# Patient Record
Sex: Female | Born: 1985 | Hispanic: No | Marital: Single | Smoking: Never smoker
Health system: Southern US, Community
[De-identification: ages and names within clinical notes are randomized; demographics above are authoritative.]

## PROBLEM LIST (undated history)

## (undated) DIAGNOSIS — J45909 Unspecified asthma, uncomplicated: Secondary | ICD-10-CM

---

## 2014-03-19 ENCOUNTER — Emergency Department (HOSPITAL_COMMUNITY)
Admission: EM | Admit: 2014-03-19 | Discharge: 2014-03-20 | Disposition: A | Discharge status: Home / Self Care | Payer: BLUE CROSS/BLUE SHIELD | Attending: Emergency Medicine | Admitting: Emergency Medicine

## 2014-03-19 ENCOUNTER — Encounter (HOSPITAL_COMMUNITY): Payer: Self-pay | Admitting: *Deleted

## 2014-03-19 DIAGNOSIS — R1012 Left upper quadrant pain: Secondary | ICD-10-CM | POA: Diagnosis present

## 2014-03-19 DIAGNOSIS — N200 Calculus of kidney: Secondary | ICD-10-CM | POA: Insufficient documentation

## 2014-03-19 DIAGNOSIS — R109 Unspecified abdominal pain: Secondary | ICD-10-CM

## 2014-03-19 DIAGNOSIS — J45909 Unspecified asthma, uncomplicated: Secondary | ICD-10-CM | POA: Diagnosis not present

## 2014-03-19 DIAGNOSIS — Z3202 Encounter for pregnancy test, result negative: Secondary | ICD-10-CM | POA: Insufficient documentation

## 2014-03-19 HISTORY — DX: Unspecified asthma, uncomplicated: J45.909

## 2014-03-19 LAB — CBC WITH DIFFERENTIAL/PLATELET
Basophils Absolute: 0 10*3/uL (ref 0.0–0.1)
Basophils Relative: 0 % (ref 0–1)
EOS PCT: 1 % (ref 0–5)
Eosinophils Absolute: 0.1 10*3/uL (ref 0.0–0.7)
HCT: 39.7 % (ref 36.0–46.0)
Hemoglobin: 13.8 g/dL (ref 12.0–15.0)
LYMPHS ABS: 1.3 10*3/uL (ref 0.7–4.0)
LYMPHS PCT: 9 % — AB (ref 12–46)
MCH: 31.7 pg (ref 26.0–34.0)
MCHC: 34.8 g/dL (ref 30.0–36.0)
MCV: 91.1 fL (ref 78.0–100.0)
MONOS PCT: 2 % — AB (ref 3–12)
Monocytes Absolute: 0.3 10*3/uL (ref 0.1–1.0)
NEUTROS ABS: 12.3 10*3/uL — AB (ref 1.7–7.7)
NEUTROS PCT: 88 % — AB (ref 43–77)
Platelets: 215 10*3/uL (ref 150–400)
RBC: 4.36 MIL/uL (ref 3.87–5.11)
RDW: 12 % (ref 11.5–15.5)
WBC: 13.9 10*3/uL — ABNORMAL HIGH (ref 4.0–10.5)

## 2014-03-19 LAB — COMPREHENSIVE METABOLIC PANEL
ALBUMIN: 4.5 g/dL (ref 3.5–5.2)
ALT: 13 U/L (ref 0–35)
AST: 16 U/L (ref 0–37)
Alkaline Phosphatase: 43 U/L (ref 39–117)
Anion gap: 11 (ref 5–15)
BUN: 17 mg/dL (ref 6–23)
CO2: 24 mmol/L (ref 19–32)
CREATININE: 0.89 mg/dL (ref 0.50–1.10)
Calcium: 9.7 mg/dL (ref 8.4–10.5)
Chloride: 103 mmol/L (ref 96–112)
GFR calc Af Amer: >90 mL/min (ref >90)
GFR calc non Af Amer: 86 mL/min — ABNORMAL LOW (ref >90)
Glucose, Bld: 141 mg/dL — ABNORMAL HIGH (ref 70–99)
Potassium: 4.3 mmol/L (ref 3.5–5.1)
SODIUM: 138 mmol/L (ref 135–145)
Total Bilirubin: 0.7 mg/dL (ref 0.3–1.2)
Total Protein: 7.2 g/dL (ref 6.0–8.3)

## 2014-03-19 LAB — URINALYSIS, ROUTINE W REFLEX MICROSCOPIC
BILIRUBIN URINE: NEGATIVE
GLUCOSE, UA: NEGATIVE mg/dL
KETONES UR: 15 mg/dL — AB
Leukocytes, UA: NEGATIVE
Nitrite: NEGATIVE
Protein, ur: NEGATIVE mg/dL
Specific Gravity, Urine: 1.019 (ref 1.005–1.030)
Urobilinogen, UA: 0.2 mg/dL (ref 0.0–1.0)
pH: 7.5 (ref 5.0–8.0)

## 2014-03-19 LAB — URINE MICROSCOPIC-ADD ON

## 2014-03-19 LAB — LIPASE, BLOOD: LIPASE: 27 U/L (ref 11–59)

## 2014-03-19 LAB — PREGNANCY, URINE: Preg Test, Ur: NEGATIVE

## 2014-03-19 NOTE — ED Notes (Signed)
The pt is c/o lt sided  Upper abd pain  Lateral side since yesterday.  She has vomited x 2.  lmp 2 weeks ago

## 2014-03-19 NOTE — ED Provider Notes (Signed)
CSN: [id number]     Arrival date & time 03/19/14  2120 History   First MD Initiated Contact with Patient 03/19/14 2333     Chief Complaint  Patient presents with  . Abdominal Pain     (Consider location/radiation/quality/duration/timing/severity/associated sxs/prior Treatment) HPI Comments: PA\atient with intermittent LUQ and Flank pain for 1 day associated with nausea and 1 episode of vomiting  At this time there is no pain   Patient is a 29 y.o. female presenting with abdominal pain. The history is provided by the patient.  Abdominal Pain Pain location:  L flank and LUQ Pain quality: aching   Pain severity:  Moderate Onset quality:  Sudden Duration:  1 day Progression:  Waxing and waning Chronicity:  New Relieved by:  None tried Worsened by:  Nothing tried Ineffective treatments:  None tried Associated symptoms: nausea and vomiting   Associated symptoms: no dysuria, no fever and no shortness of breath     Past Medical History  Diagnosis Date  . Asthma    History reviewed. No pertinent past surgical history. No family history on file. History  Substance Use Topics  . Smoking status: Never Smoker   . Smokeless tobacco: Not on file  . Alcohol Use: Yes   OB History    No data available     Review of Systems  Constitutional: Negative for fever.  Respiratory: Negative for shortness of breath.   Gastrointestinal: Positive for nausea, vomiting and abdominal pain.  Genitourinary: Positive for flank pain. Negative for dysuria, frequency and vaginal pain.  Musculoskeletal: Negative for back pain.  Skin: Negative for rash and wound.  All other systems reviewed and are negative.     Allergies  Review of patient's allergies indicates no known allergies.  Home Medications   Prior to Admission medications   Not on File   BP 125/74 mmHg  Pulse 80  Temp(Src) 98.6 F (37 C)  Resp 18  Ht  (1.626 m)  Wt 121 lb 4.1 oz (55 kg)  BMI 20.80 kg/m2  SpO2 99%  LMP  03/05/2014 Physical Exam  Constitutional: She appears well-developed and well-nourished.  HENT:  Head: Normocephalic.  Eyes: Pupils are equal, round, and reactive to light.  Neck: Normal range of motion.  Cardiovascular: Normal rate.   Pulmonary/Chest: Effort normal.  Abdominal: Soft. She exhibits no distension.  No pain at this time  Musculoskeletal: Normal range of motion.  Neurological: She is alert.  Skin: Skin is warm.  Nursing note and vitals reviewed.   ED Course  Procedures (including critical care time) Labs Review Labs Reviewed  CBC WITH DIFFERENTIAL/PLATELET - Abnormal; Notable for the following:    WBC 13.9 (*)    Neutrophils Relative % 88 (*)    Neutro Abs 12.3 (*)    Lymphocytes Relative 9 (*)    Monocytes Relative 2 (*)    All other components within normal limits  COMPREHENSIVE METABOLIC PANEL - Abnormal; Notable for the following:    Glucose, Bld 141 (*)    GFR calc non Af Amer 86 (*)    All other components within normal limits  URINALYSIS, ROUTINE W REFLEX MICROSCOPIC - Abnormal; Notable for the following:    APPearance TURBID (*)    Hgb urine dipstick LARGE (*)    Ketones, ur 15 (*)    All other components within normal limits  LIPASE, BLOOD  PREGNANCY, URINE  URINE MICROSCOPIC-ADD ON    Imaging Review No results found.   EKG Interpretation None  Patient's ultrasound showed she has moderate hydro-on the left.  No discrete stone is visualized at this time.  Patient will be discharged home with antiemetic and pain control and referral to urology for further evaluation MDM   Final diagnoses:  Flank pain         Arman FilterGail K Tameshia Bonneville, NP 03/20/14 [id number]  Derwood KaplanAnkit Nanavati, MD 03/21/14 [phone]

## 2014-03-20 ENCOUNTER — Emergency Department (HOSPITAL_COMMUNITY): Payer: BLUE CROSS/BLUE SHIELD

## 2014-03-20 MED ORDER — HYDROCODONE-ACETAMINOPHEN 5-325 MG PO TABS: 2.0000 | ORAL_TABLET | ORAL | Status: AC | PRN

## 2014-03-20 MED ORDER — ONDANSETRON 4 MG PO TBDP: 4.0000 mg | ORAL_TABLET | Freq: Three times a day (TID) | ORAL | Status: AC | PRN

## 2014-03-20 NOTE — ED Notes (Signed)
Pt remains in ultrasound at the time. 

## 2014-03-20 NOTE — ED Notes (Signed)
Spoke with ultrasound personnel regarding ETA for exam. US staff states that patient will be retrieved soon.

## 2014-03-20 NOTE — Discharge Instructions (Signed)
Her ultrasound shows that you have had a kidney stone on the left.  There is small amount of swelling of the kidney, but no blockage U been given a prescription for pain control, as well as an antiemetic to control any nausea or vomiting.  He may develop.  He will also be given referral to urology for follow-up if needed

## 2014-04-22 ENCOUNTER — Other Ambulatory Visit (HOSPITAL_COMMUNITY): Payer: Self-pay | Admitting: Family Medicine

## 2014-04-22 DIAGNOSIS — R319 Hematuria, unspecified: Secondary | ICD-10-CM

## 2014-04-22 DIAGNOSIS — N1330 Unspecified hydronephrosis: Secondary | ICD-10-CM

## 2014-05-04 ENCOUNTER — Ambulatory Visit (HOSPITAL_COMMUNITY)
Admission: RE | Admit: 2014-05-04 | Discharge: 2014-05-04 | Disposition: A | Discharge status: Home / Self Care | Payer: BLUE CROSS/BLUE SHIELD | Source: Ambulatory Visit | Attending: Family Medicine | Admitting: Family Medicine

## 2014-05-04 DIAGNOSIS — N1330 Unspecified hydronephrosis: Secondary | ICD-10-CM | POA: Insufficient documentation

## 2014-05-04 DIAGNOSIS — R319 Hematuria, unspecified: Secondary | ICD-10-CM

## 2014-05-11 ENCOUNTER — Other Ambulatory Visit (HOSPITAL_COMMUNITY): Payer: Self-pay | Admitting: Family Medicine

## 2014-05-11 DIAGNOSIS — N1330 Unspecified hydronephrosis: Secondary | ICD-10-CM

## 2016-06-09 IMAGING — US US ABDOMEN COMPLETE
1 series · 14 of 25 positions shown · non-contrast
Comparison: None.

CLINICAL DATA: Left upper abdominal pain. Microhematuria. Left
flank pain.

EXAM:
ULTRASOUND ABDOMEN COMPLETE

[Series 1: us abdomen complete · 0.24mm/px · 14 of 79 slices shown]
[im 1/79]
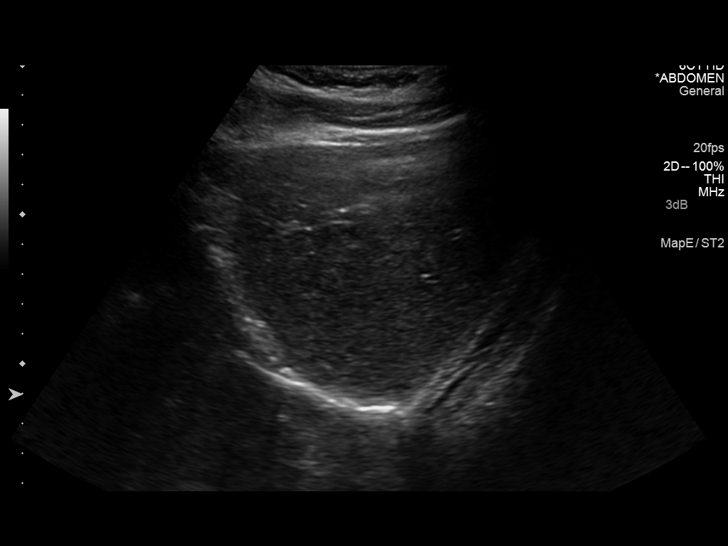
[im 7/79]
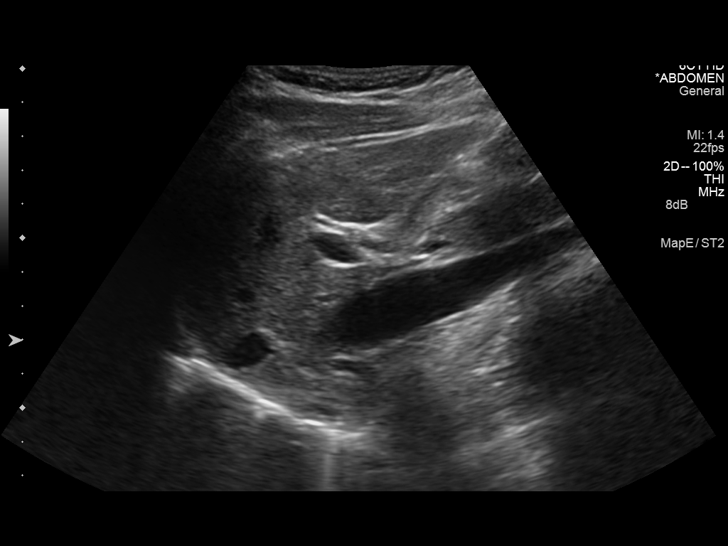
[im 14/79]
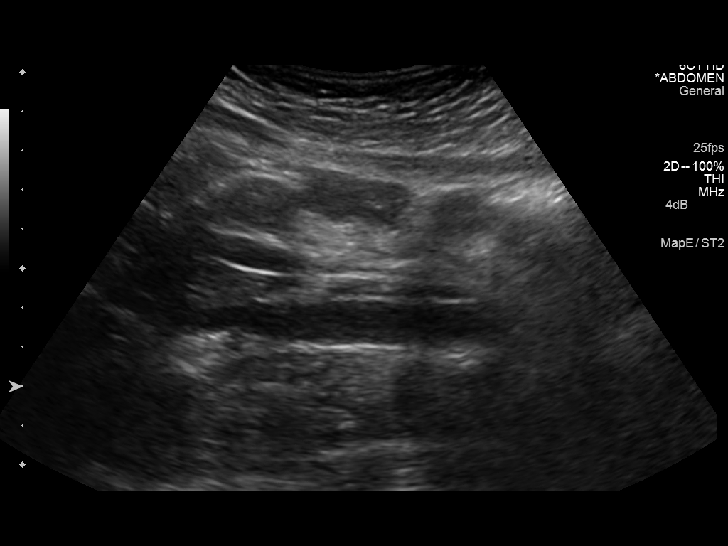
[im 20/79]
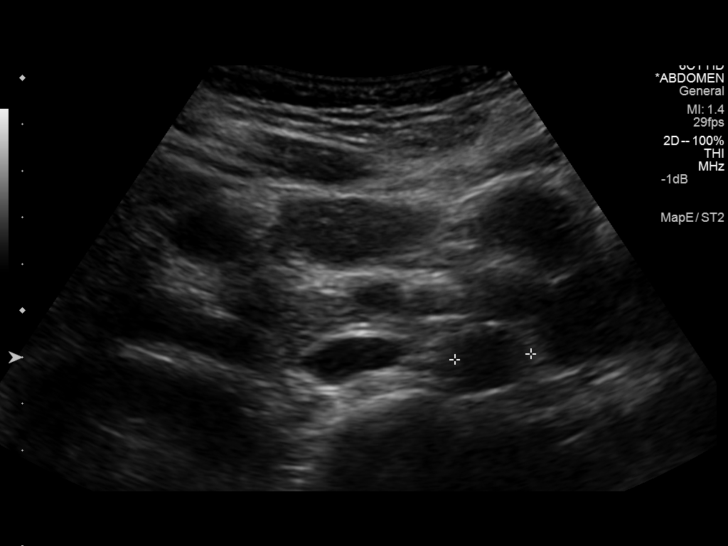
[im 27/79]
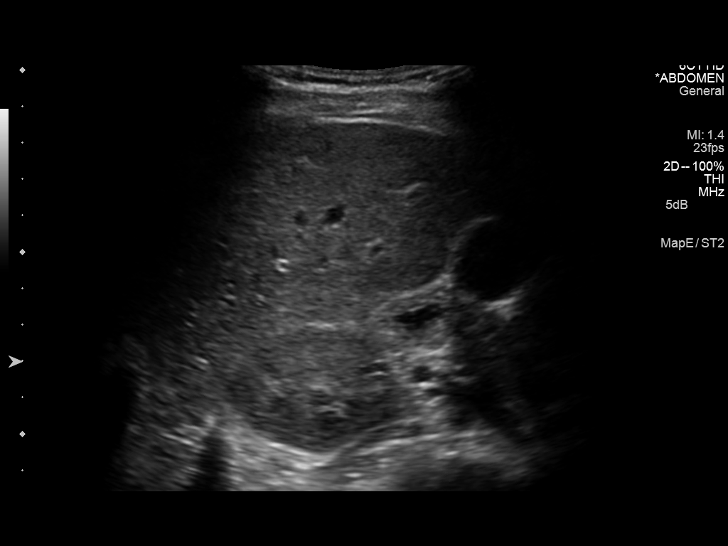
[im 30/79]
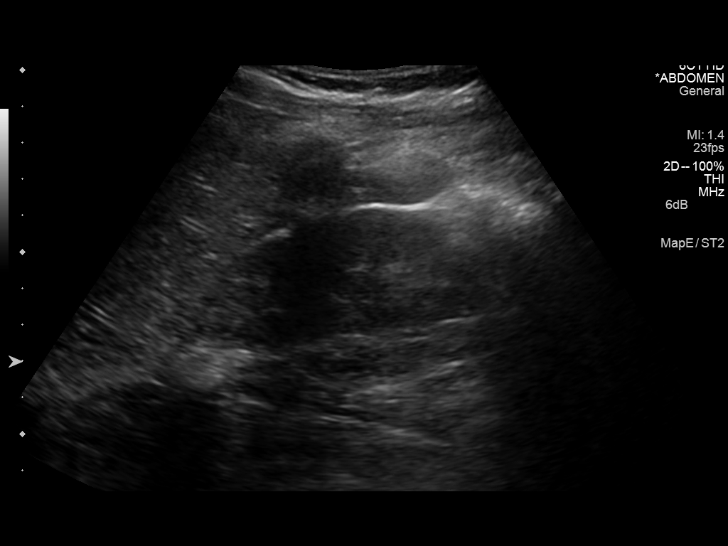
[im 36/79]
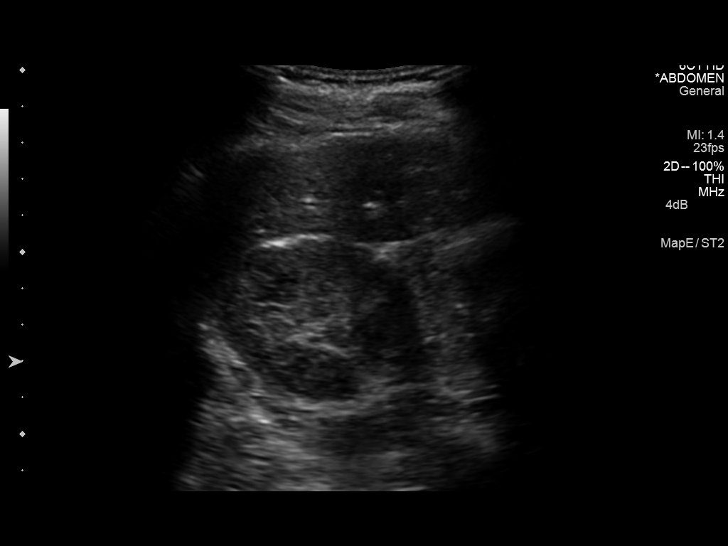
[im 43/79]
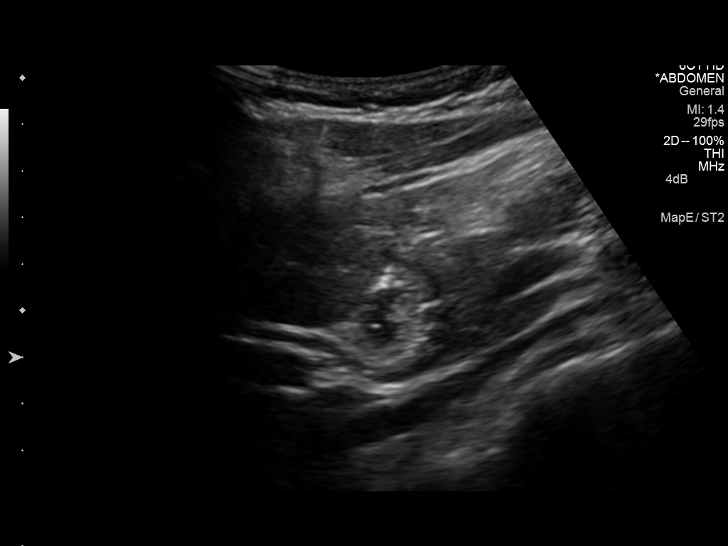
[im 49/79]
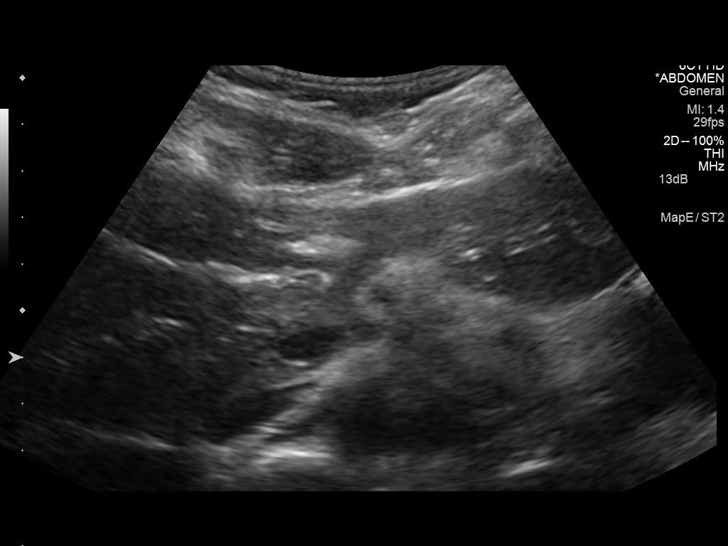
[im 53/79]
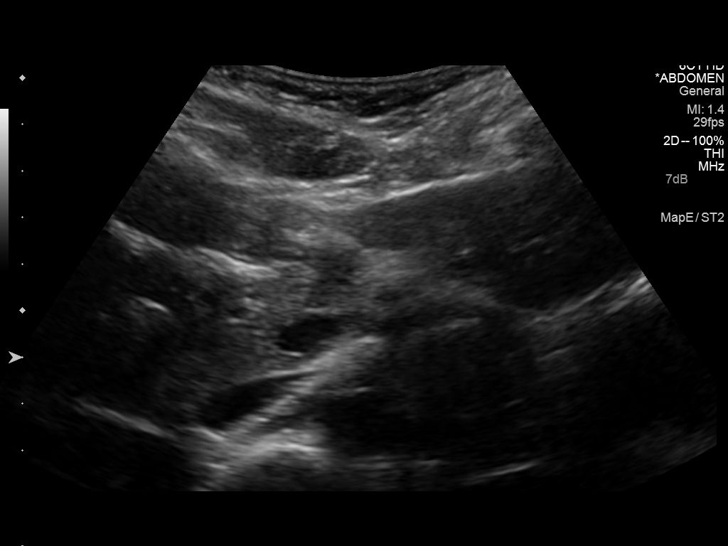
[im 59/79]
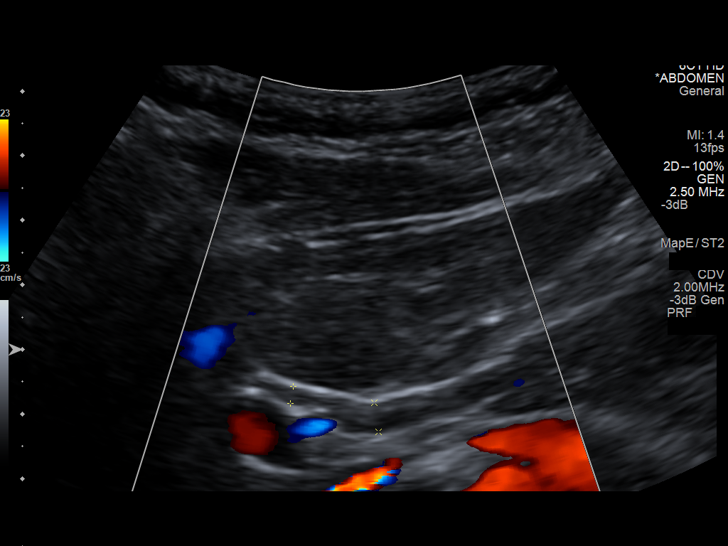
[im 66/79]
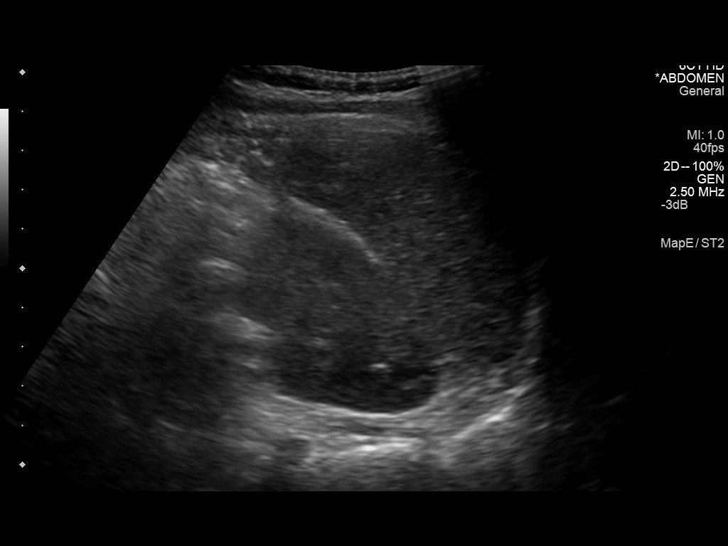
[im 72/79]
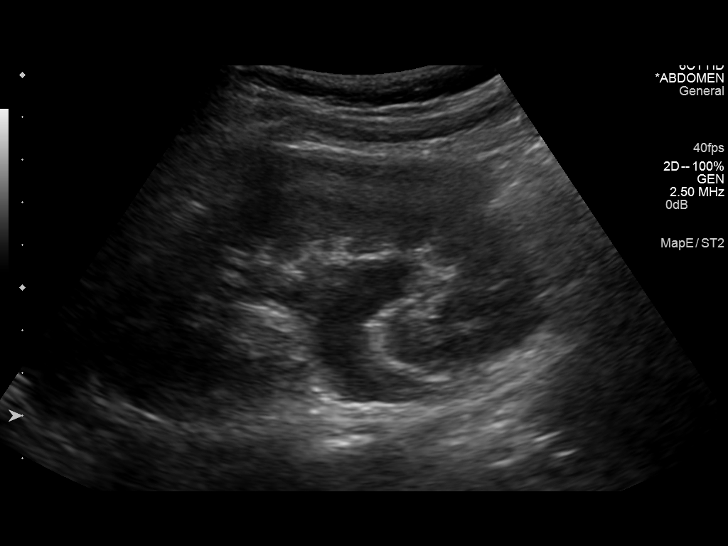
[im 79/79]
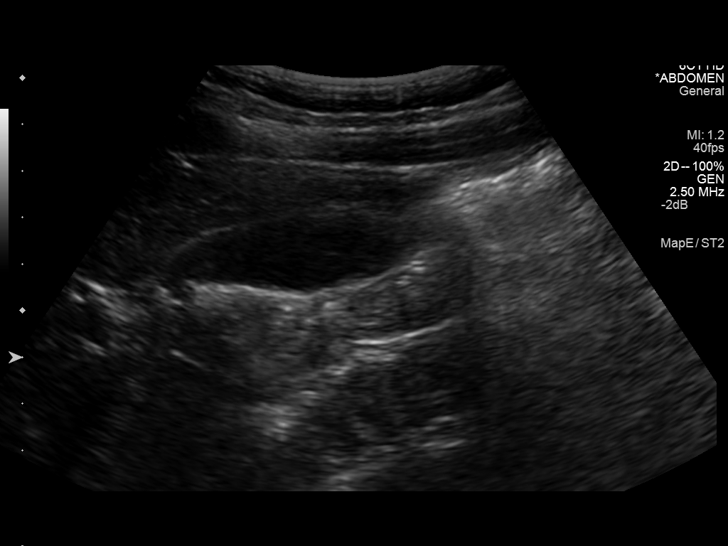

[14 of 25 positions shown; findings below may reference images not displayed]

FINDINGS: Gallbladder: Small echogenic polypoid filling defect along the
nondependent surface the gallbladder measuring 1.4 mm. This likely
represents a benign polyp. No stones, sludge, wall thickening, or
edema. Murphy's sign is negative.

Common bile duct: Diameter: 4.6 mm, normal

Liver: No focal lesion identified. Within normal limits in
parenchymal echogenicity.

IVC: No abnormality visualized.

Pancreas: Limited visualization due to overlying bowel gas.

Spleen: Size and appearance within normal limits.

Right Kidney: Length: 11.2 cm. Echogenicity within normal limits. No
mass or hydronephrosis visualized.

Left Kidney: Length: 10.2 cm. Mild to moderate left-sided
hydronephrosis. No cause identified but the ureter is not visualized
distal to the renal pelvis.

Abdominal aorta: No aneurysm visualized.

Other findings: None.
IMPRESSION: Left renal hydronephrosis. Obstruction not excluded. Small
gallbladder polyp appears benign. Examination is otherwise
unremarkable.

## 2016-07-24 IMAGING — US US RENAL
1 series · 14 of 25 positions shown · non-contrast
Comparison: 03/20/2014

CLINICAL DATA: Left hydronephrosis and hematuria.

EXAM:
RENAL/URINARY TRACT ULTRASOUND COMPLETE

[Series 1: us renal · 0.20mm/px · 14 of 41 slices shown]
[im 1/41]
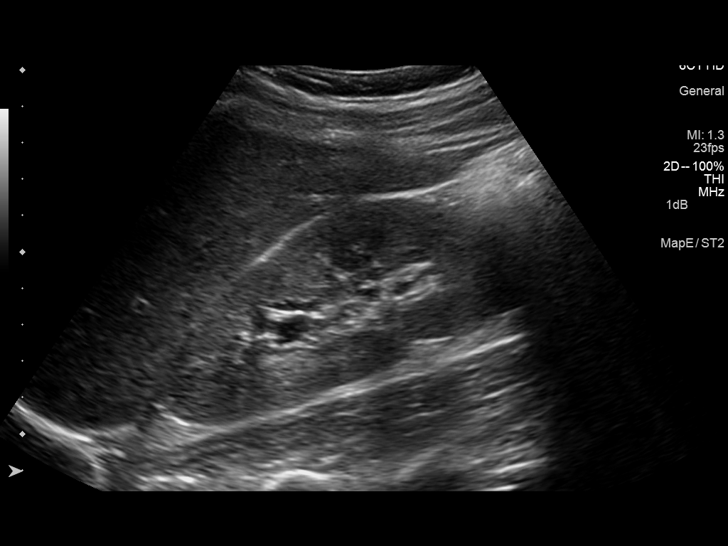
[im 4/41]
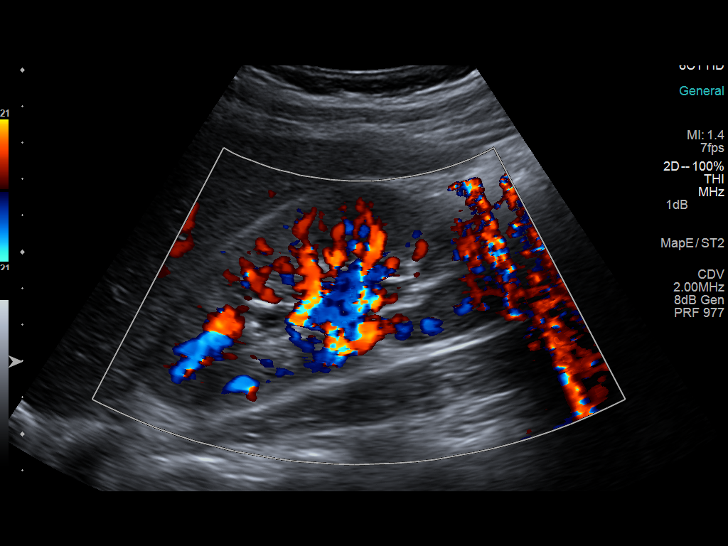
[im 7/41]
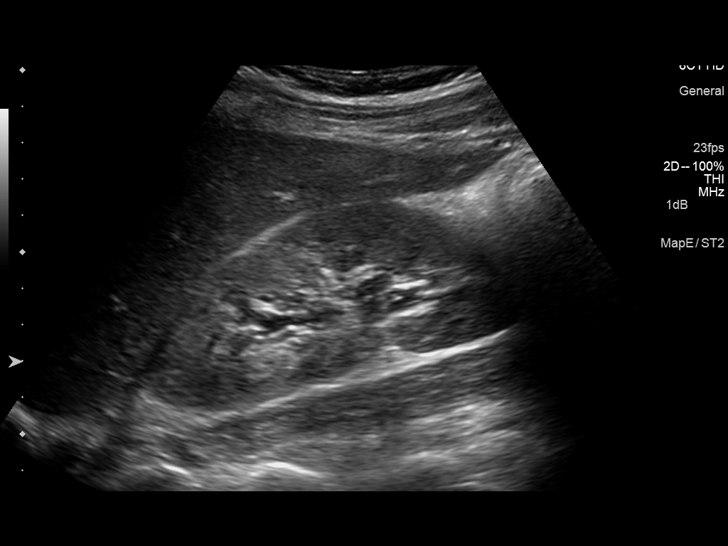
[im 11/41]
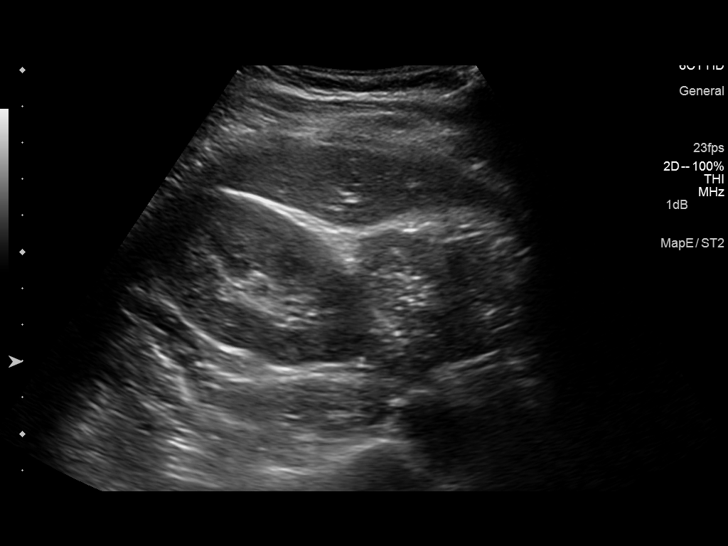
[im 14/41]
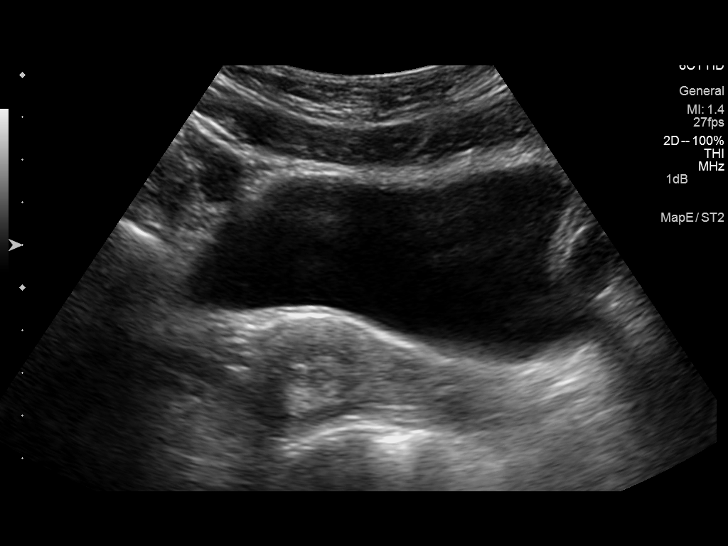
[im 16/41]
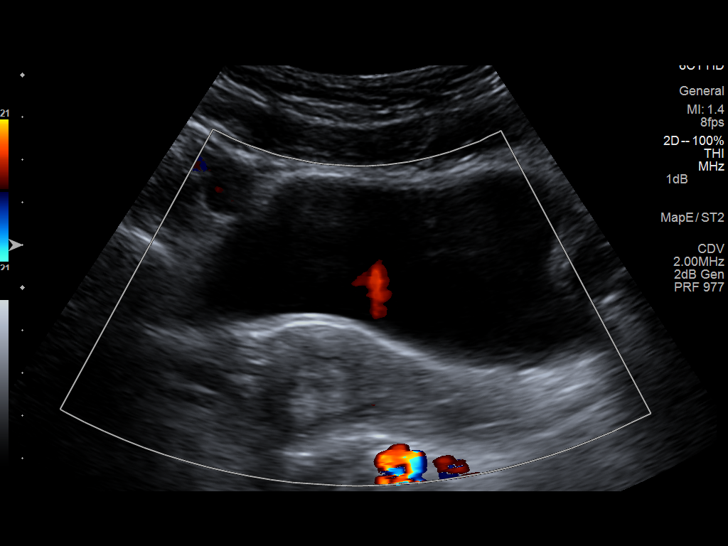
[im 19/41]
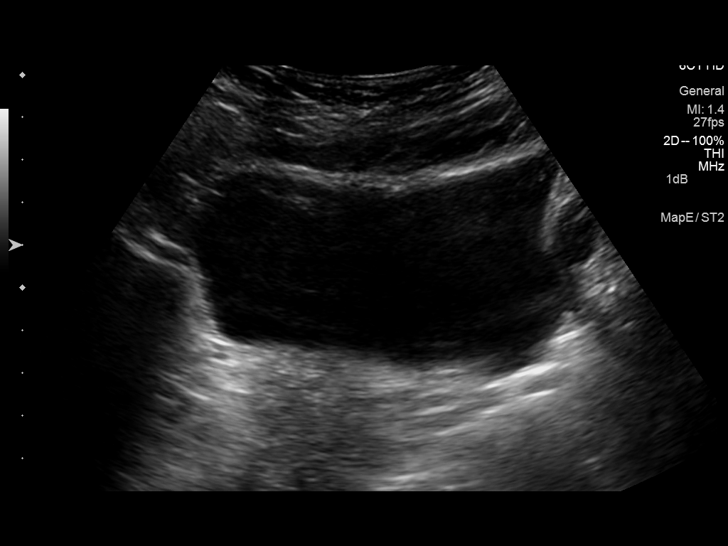
[im 22/41]
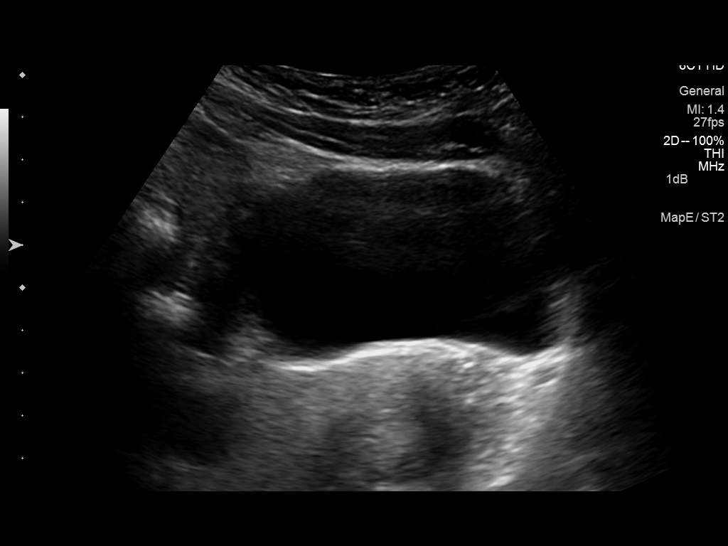
[im 26/41]
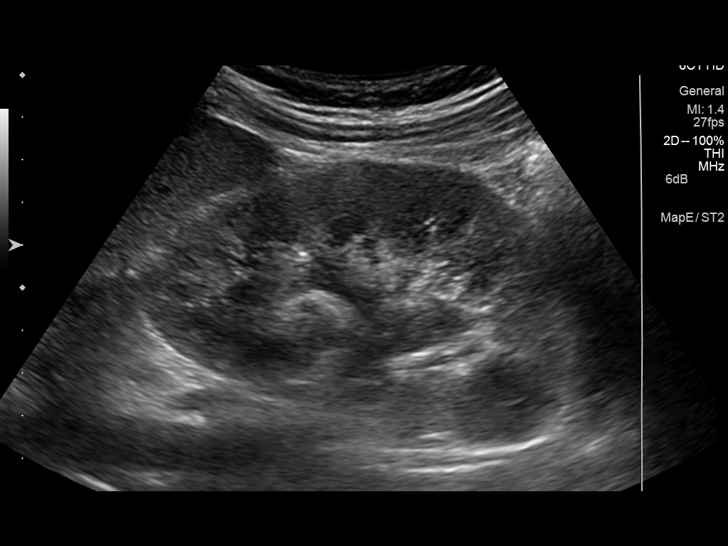
[im 27/41]
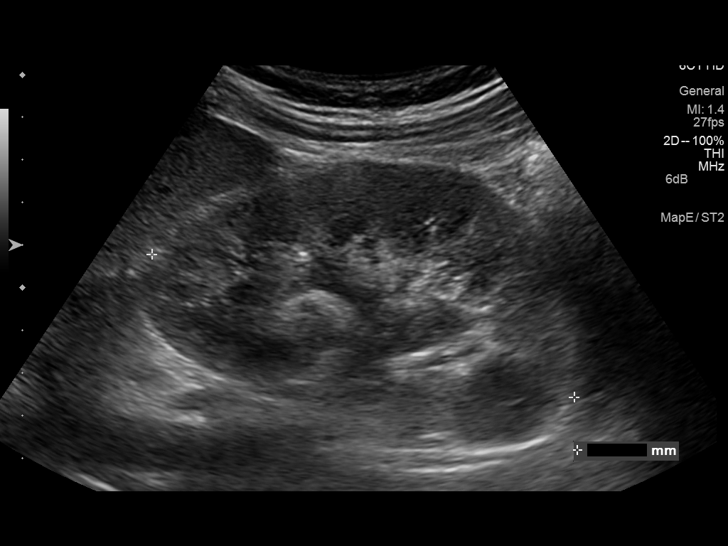
[im 31/41]
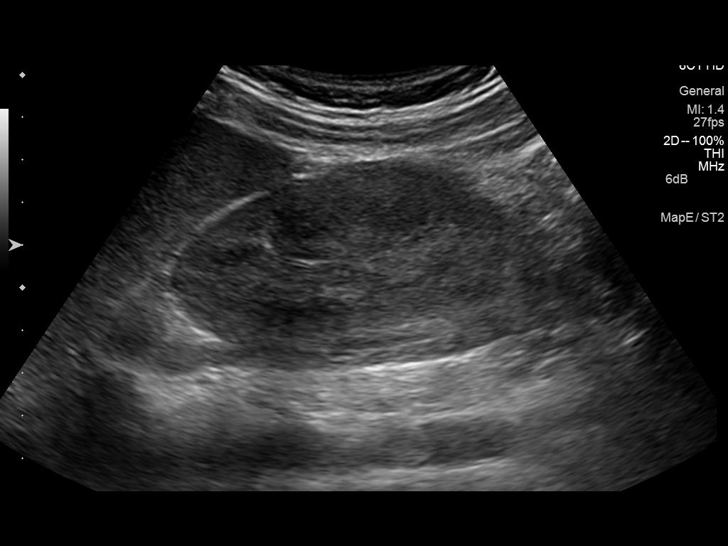
[im 34/41]
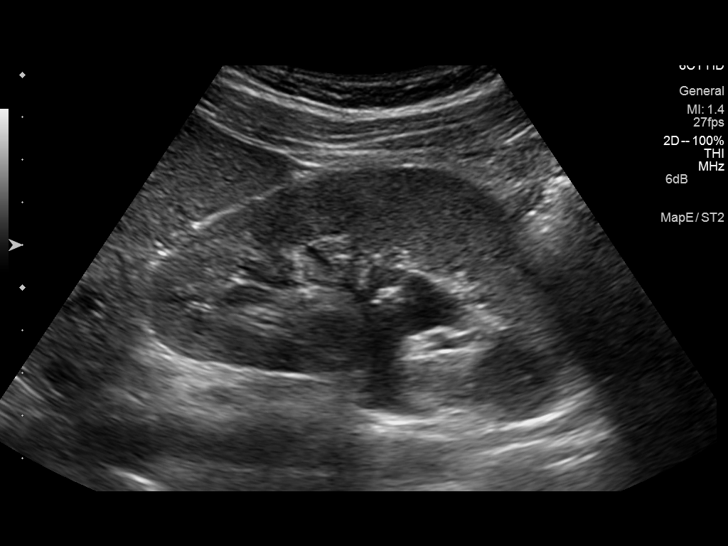
[im 37/41]
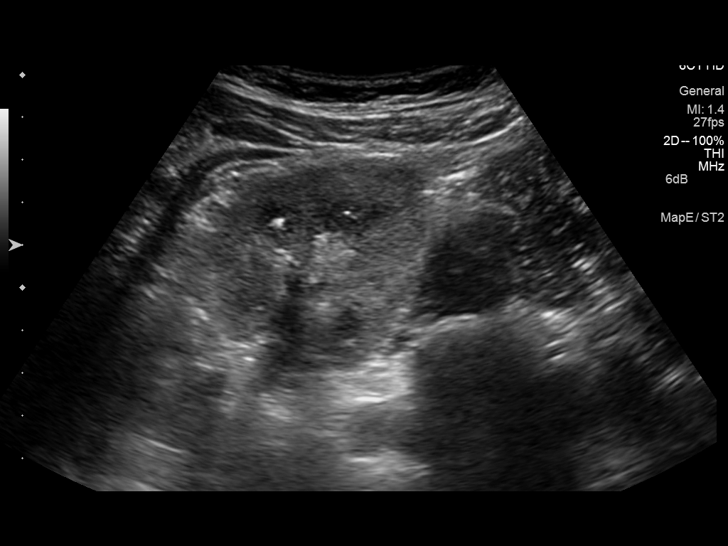
[im 41/41]
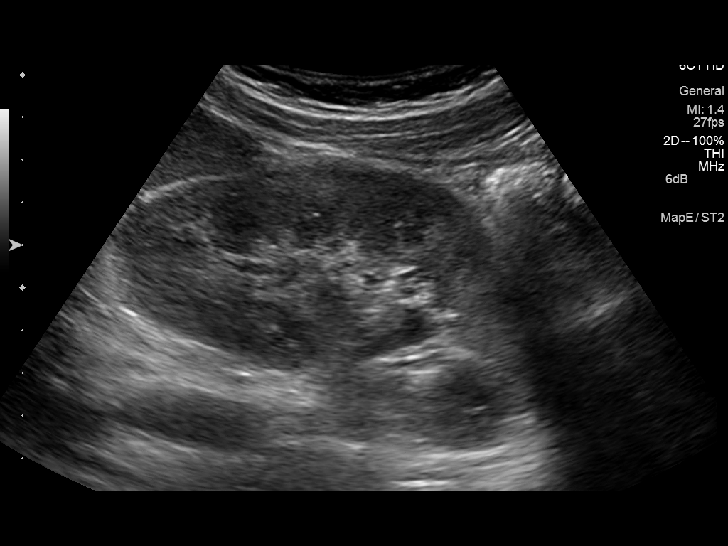

[14 of 25 positions shown; findings below may reference images not displayed]

FINDINGS: Right Kidney:

Length: 11.4 cm. Echogenicity within normal limits. No mass or
hydronephrosis visualized.

Left Kidney:

Length: 10.5 cm. Fullness of the renal pelvis and upper ureter is
again seen. There is no caylx dilatation typical of hydronephrosis.
Occasional medullary echogenicity is likely a technical finding,
there is no associated shadowing or twinkling artifact.

Bladder:

Appears normal for degree of bladder distention. Left ureteral jet
is present.
IMPRESSION: Unchanged left pelviectasis. There is no ureteral obstruction based
on present ureteral jet.
